# Patient Record
Sex: Male | Born: 1950 | Race: White | Hispanic: No | Marital: Married | Smoking: Heavy tobacco smoker
Health system: Southern US, Community
[De-identification: ages and names within clinical notes are randomized; demographics above are authoritative.]

## PROBLEM LIST (undated history)

## (undated) DIAGNOSIS — J449 Chronic obstructive pulmonary disease, unspecified: Secondary | ICD-10-CM

## (undated) DIAGNOSIS — C801 Malignant (primary) neoplasm, unspecified: Secondary | ICD-10-CM

## (undated) HISTORY — PX: BLADDER REMOVAL: SHX567

---

## 2006-05-11 ENCOUNTER — Emergency Department (HOSPITAL_COMMUNITY): Admission: EM | Admit: 2006-05-11 | Discharge: 2006-05-11 | Payer: Self-pay | Admitting: Emergency Medicine

## 2006-05-20 ENCOUNTER — Ambulatory Visit (HOSPITAL_COMMUNITY): Admission: RE | Admit: 2006-05-20 | Discharge: 2006-05-20 | Payer: Self-pay | Admitting: Anesthesiology

## 2006-06-13 ENCOUNTER — Encounter: Admission: RE | Admit: 2006-06-13 | Discharge: 2006-06-13 | Payer: Self-pay | Admitting: Orthopedic Surgery

## 2008-08-18 IMAGING — CR DG CHEST 2V
2 series · 2 of 2 positions shown · non-contrast
Comparison: none

CLINICAL DATA: 55-year-old male, preop for open reduction calcaneal fractures.  
 CHEST ? 2 VIEW ? 05/20/06:

[view not recorded (1 of 2)]
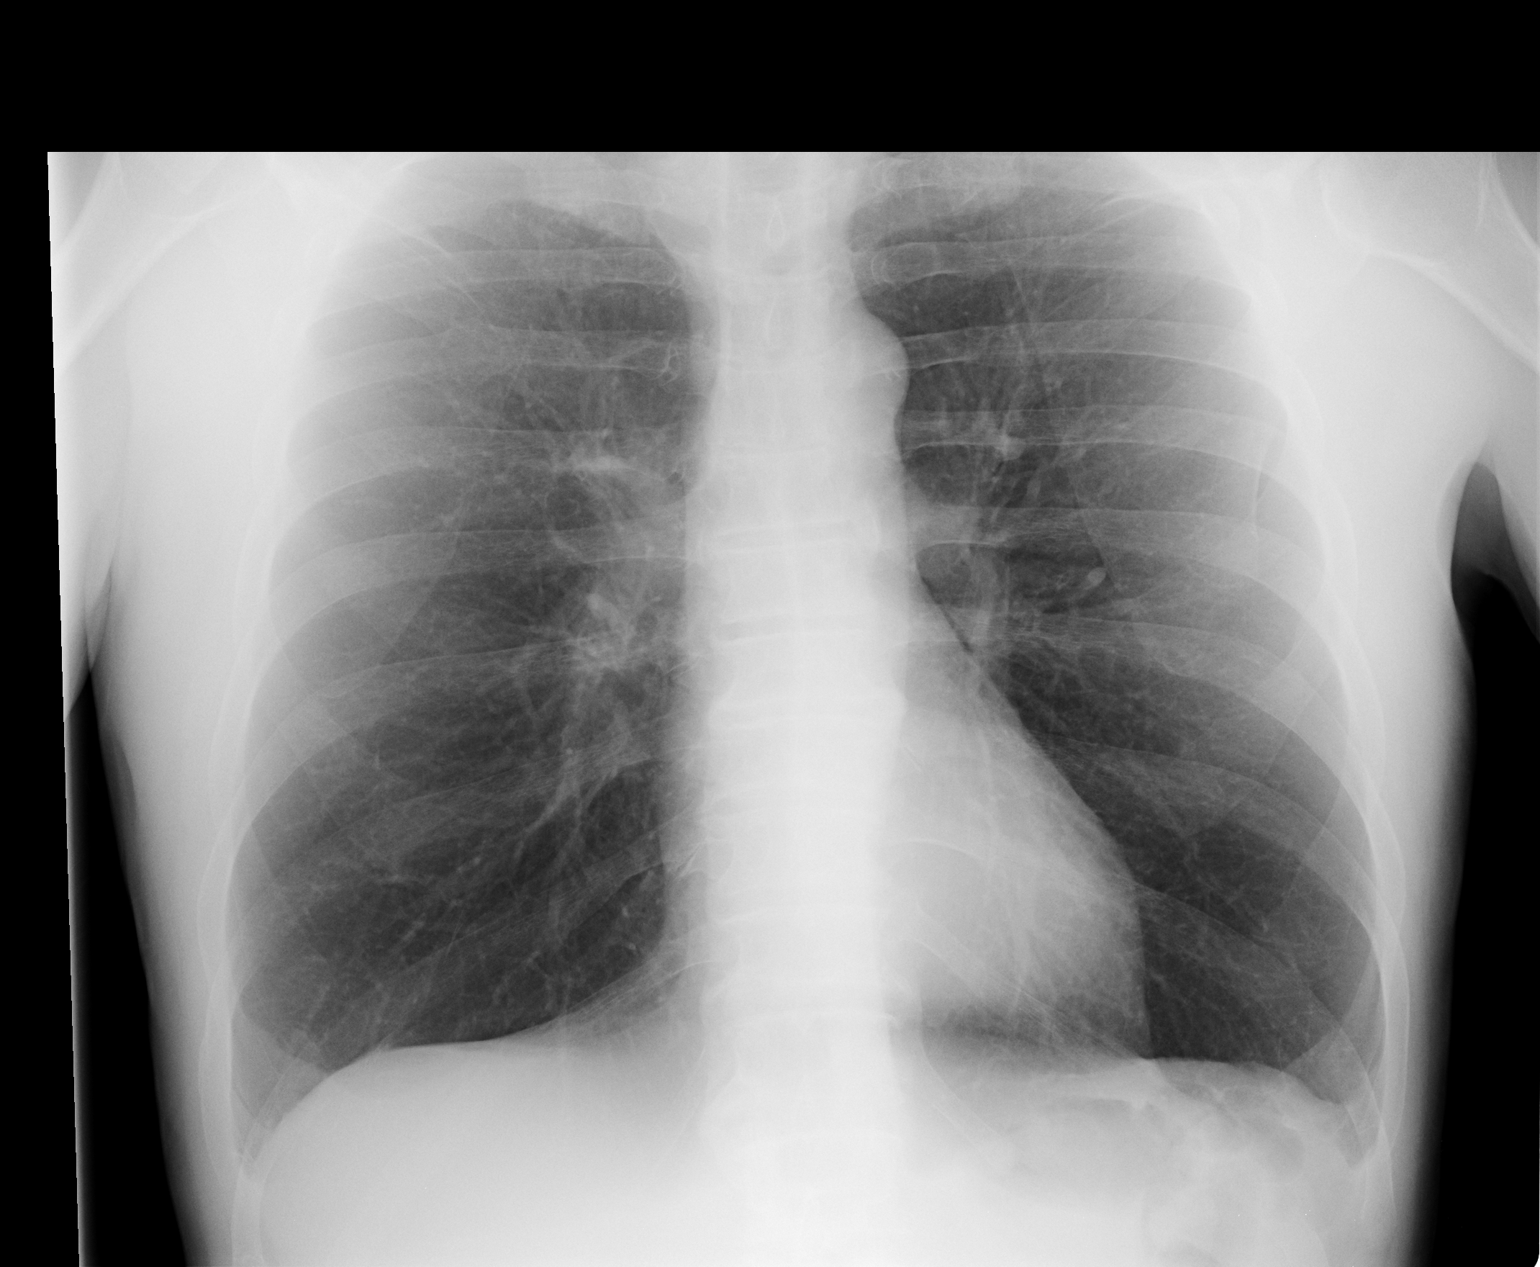

[view not recorded (2 of 2)]
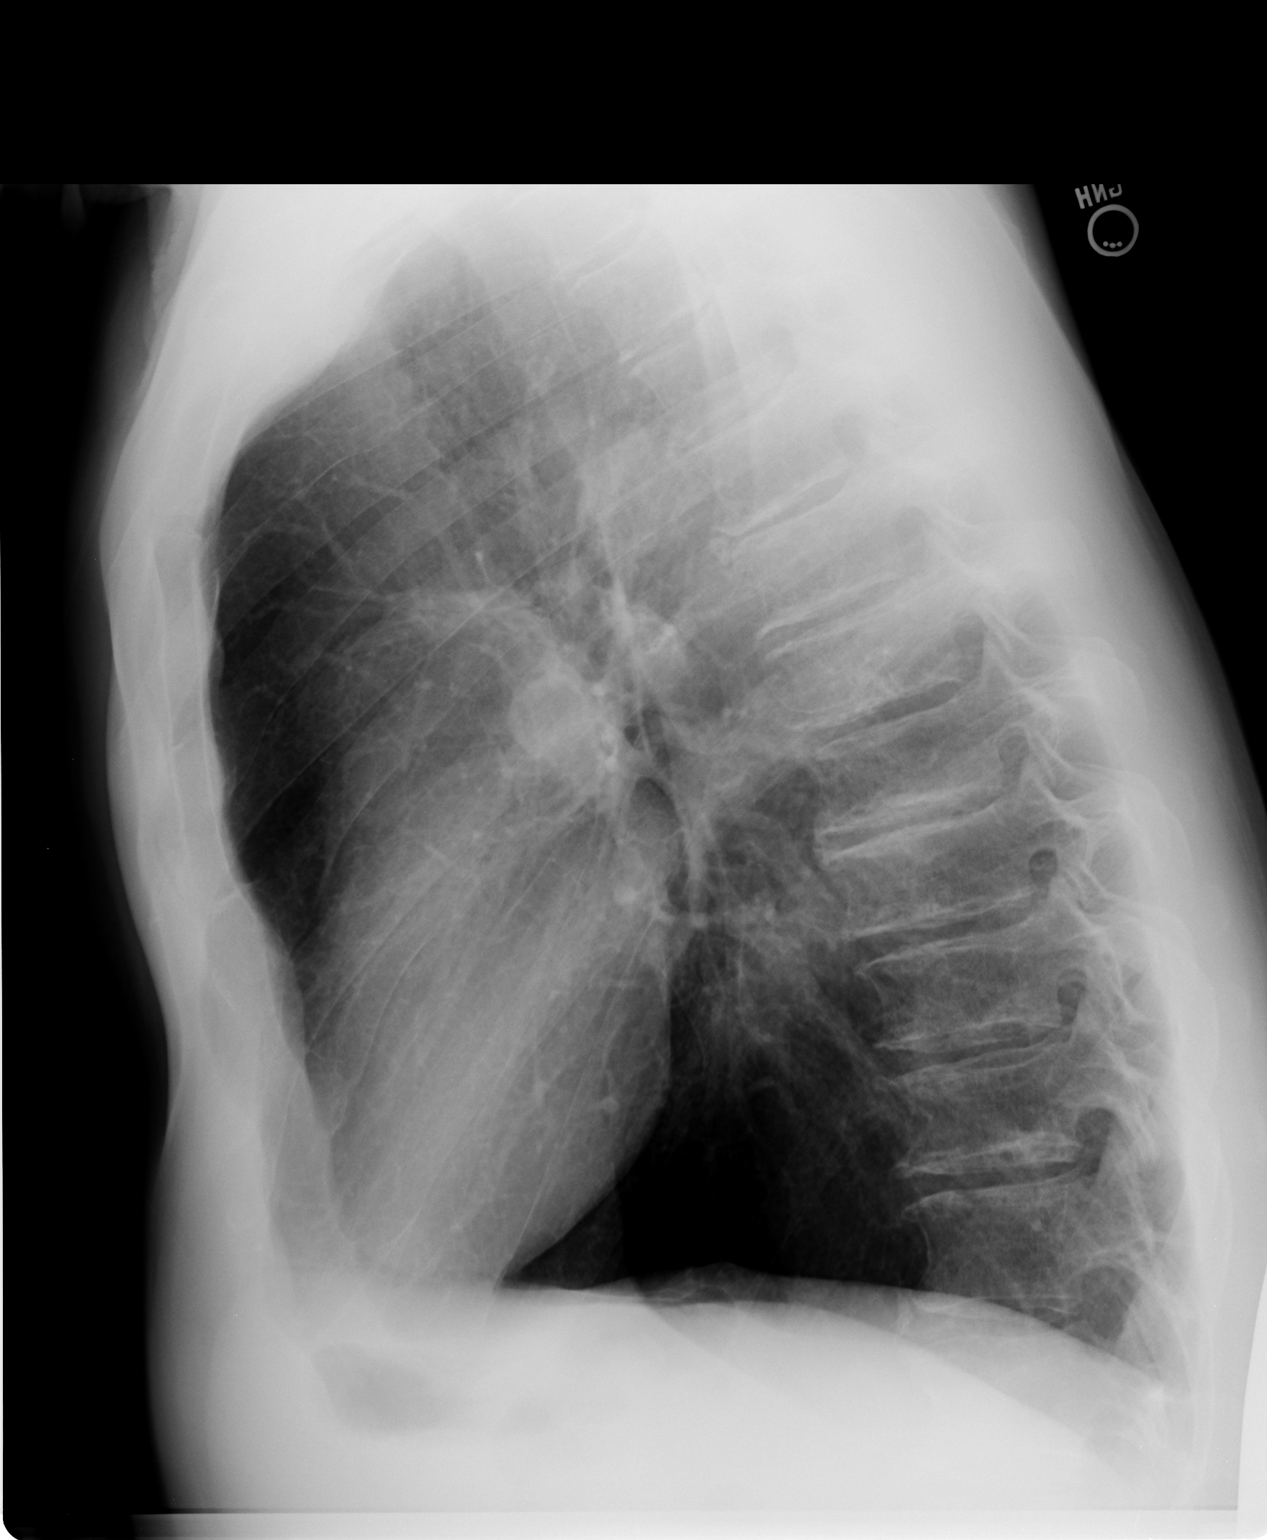

[2 of 2 positions shown; findings below may reference images not displayed]

FINDINGS: The cardiopericardial silhouette is within normal limits for size.  The lungs are hyperexpanded and hyperlucent, compatible with COPD.  No focal airspace disease is evident.  Marked degenerative changes are evident within the thoracic spine.
IMPRESSION: Changes of moderate to severe COPD without focal airspace disease.

## 2008-09-15 ENCOUNTER — Encounter: Admission: RE | Admit: 2008-09-15 | Discharge: 2008-09-15 | Payer: Self-pay | Admitting: Occupational Medicine

## 2010-09-01 NOTE — Op Note (Signed)
NAME:  HANEEF, HALLQUIST NO.:  [id number]   MEDICAL RECORD NO.:  [id number]          PATIENT TYPE:  AMB   LOCATION:  SDS                          FACILITY:  [facility]   PHYSICIAN:  Myrtie Neither, MD      DATE OF BIRTH:  June 22, 1950   DATE OF PROCEDURE:  05/20/2006  DATE OF DISCHARGE:                               OPERATIVE REPORT   PREOPERATIVE DIAGNOSIS:  Bilateral calcaneal fractures comminuted.   POSTOPERATIVE DIAGNOSIS:  Bilateral calcaneal fractures comminuted.   ANESTHESIA:  General.   PROCEDURE:  Closed manipulated reduction bilateral calcaneal fractures  and application of short-leg cast.   DESCRIPTION OF PROCEDURE:  The patient was taken to the operating room  and given general anesthesia and placed in the prone position.  C-arm  was used to visualize fracture reduction. Manipulated reduction of the  right calcaneal fracture was done initially followed by application of  fiberglass short leg cast. Next the left calcaneal fracture was  manipulated to reduce girth of the fracture and attempted to try  compressive fragments as close as possible to normal anatomy.  After  adequate manipulation visualization the C-arm demonstrated much improved  the fracture alignment. Short-leg cast was applied, fiberglass. The  patient tolerated procedure quite well recovery room in stable and  satisfactory position.      Myrtie Neither, MD  Electronically Signed     AC/MEDQ  D:  05/20/2006  T:  05/20/2006  Job:  [id number]

## 2012-01-01 ENCOUNTER — Emergency Department (HOSPITAL_COMMUNITY): Payer: Self-pay

## 2012-01-01 ENCOUNTER — Encounter (HOSPITAL_COMMUNITY): Payer: Self-pay | Admitting: *Deleted

## 2012-01-01 ENCOUNTER — Emergency Department (HOSPITAL_COMMUNITY)
Admission: EM | Admit: 2012-01-01 | Discharge: 2012-01-01 | Disposition: A | Discharge status: Home / Self Care | Payer: Self-pay | Attending: Emergency Medicine | Admitting: Emergency Medicine

## 2012-01-01 DIAGNOSIS — J4489 Other specified chronic obstructive pulmonary disease: Secondary | ICD-10-CM | POA: Insufficient documentation

## 2012-01-01 DIAGNOSIS — J439 Emphysema, unspecified: Secondary | ICD-10-CM

## 2012-01-01 DIAGNOSIS — F17200 Nicotine dependence, unspecified, uncomplicated: Secondary | ICD-10-CM | POA: Insufficient documentation

## 2012-01-01 DIAGNOSIS — J449 Chronic obstructive pulmonary disease, unspecified: Secondary | ICD-10-CM | POA: Insufficient documentation

## 2012-01-01 HISTORY — DX: Chronic obstructive pulmonary disease, unspecified: J44.9

## 2012-01-01 MED ORDER — ALBUTEROL SULFATE HFA 108 (90 BASE) MCG/ACT IN AERS
2.0000 | INHALATION_SPRAY | RESPIRATORY_TRACT | Status: DC | PRN
  Administered 2012-01-01: 2 via RESPIRATORY_TRACT
  Filled 2012-01-01: qty 6.7

## 2012-01-01 MED ORDER — ALBUTEROL SULFATE (5 MG/ML) 0.5% IN NEBU
5.0000 mg | INHALATION_SOLUTION | Freq: Once | RESPIRATORY_TRACT | Status: AC
  Administered 2012-01-01: 5 mg via RESPIRATORY_TRACT

## 2012-01-01 MED ORDER — PREDNISONE 10 MG PO TABS: 10.0000 mg | ORAL_TABLET | Freq: Two times a day (BID) | ORAL | Status: DC

## 2012-01-01 MED ORDER — IPRATROPIUM BROMIDE 0.02 % IN SOLN
0.5000 mg | Freq: Once | RESPIRATORY_TRACT | Status: AC
  Administered 2012-01-01: 0.5 mg via RESPIRATORY_TRACT

## 2012-01-01 NOTE — ED Notes (Addendum)
Denies sob, (pt is a "smoker, woodworker, chews tobacco"). Reports: "could not get enough air earlier tonight", onset 3hrs ago, gradually progressively worse. "Starting to ease up in triage". Thought i would pass out & felt dizzy PTA. (denies: pain, nvd, fever), has chronic congested smokers cough for yrs. Increase wob, insp/exp wheezing.

## 2012-01-01 NOTE — ED Provider Notes (Signed)
History     CSN: [id number]  Arrival date & time 01/01/12  2110   First MD Initiated Contact with Patient 01/01/12 2327      Chief Complaint  Patient presents with  . Shortness of Breath    (Consider location/radiation/quality/duration/timing/severity/associated sxs/prior treatment) HPI History provided by pt.   Pt presents w/ c/o SOB.  Acute onset at 7pm while getting into bed.  Mildly aggravated by exertion and improves w/ lying flat.  No associated fever, worse than baseline cough, CP, N/V/D, LE pain/edema.  Had a breathing treatment in triage w/ relief of symptoms and is now able to ambulate w/out dyspnea.  No RF for PE.  Pt smokes 3/4 PPD but otherwise no RF for ACS.    Past Medical History  Diagnosis Date  . COPD (chronic obstructive pulmonary disease)     History reviewed. No pertinent past surgical history.  No family history on file.  History  Substance Use Topics  . Smoking status: Heavy Tobacco Smoker  . Smokeless tobacco: Current User  . Alcohol Use: Yes      Review of Systems  All other systems reviewed and are negative.    Allergies  Review of patient's allergies indicates no known allergies.  Home Medications   Current Outpatient Rx  Name Route Sig Dispense Refill  . ADULT MULTIVITAMIN W/MINERALS CH Oral Take 1 tablet by mouth daily.      BP 125/108  Pulse 92  Temp 97.8 F (36.6 C) (Oral)  Resp 20  SpO2 97%  Physical Exam  Nursing note and vitals reviewed. Constitutional: He is oriented to person, place, and time. He appears well-developed and well-nourished. No distress.  HENT:  Head: Normocephalic and atraumatic.  Eyes:       Normal appearance  Neck: Normal range of motion.  Cardiovascular: Normal rate, regular rhythm and intact distal pulses.   Pulmonary/Chest: Effort normal and breath sounds normal. No respiratory distress. He exhibits no tenderness.       No pleuritic pain reported  Abdominal: Soft. Bowel sounds are normal. He  exhibits no distension. There is no tenderness. There is no guarding.  Musculoskeletal: Normal range of motion.       No peripheral edema or calf tenderness  Neurological: He is alert and oriented to person, place, and time.  Skin: Skin is warm and dry. No rash noted.  Psychiatric: He has a normal mood and affect. His behavior is normal.    ED Course  Procedures (including critical care time)   Date: 01/01/2012  Rate: 84  Rhythm: normal sinus rhythm  QRS Axis: normal  Intervals: normal  ST/T Wave abnormalities: normal  Conduction Disutrbances:none  Narrative Interpretation:   Old EKG Reviewed: none available   Labs Reviewed - No data to display Dg Chest 2 View  01/01/2012  *RADIOLOGY REPORT*  Clinical Data: Shortness of breath.  History of COPD.  CHEST - 2 VIEW  Comparison: Plain film chest 09/15/2008.  Findings: The chest is hyperexpanded with attenuation the pulmonary vasculature.  Lungs are clear.  The heart size is normal.  No pneumothorax or pleural fluid.  IMPRESSION: Emphysema without acute disease.   Original Report Authenticated By: Bernadene Bell. D'ALESSIO, M.D.      1. Emphysema       MDM  61yo smoker, otherwise healthy, presents w/ dyspnea.  No RF for PE and low risk ACS.  On exam, VS w/in nml range, no respiratory distress, lungs clear but sounds slightly diminished, no LE edema/ttp.  EKG non-ischemic, CXR shows emphysema and pt had relief of sx w/ breathing treatment in triage.  He is ambulatory in [employer] w/out SOB.  Symptoms likely d/t COPD exacerbation.  Pt d/c'd home w/ albuterol inhaler, 5 day course of prednisone and referral to [facility].  Return precautions discussed.         Otilio Miu, [place] 01/01/12 [phone]

## 2012-01-01 NOTE — ED Notes (Signed)
The pt was lying in bed when ever he began to get sob at approx  1900.  He is currently a smoker and reports he has been breathing in  [employer].  No nv he has had no appetite for the past few months and he has lost weight.  He is alert denies pain anywhere.  He still has rapid respirations but reports that he is breathing better.

## 2012-01-01 NOTE — ED Notes (Signed)
Wanting to leave, encouraged to stay, rationale given, out to speak with wife in w/r.

## 2012-01-02 NOTE — ED Provider Notes (Signed)
Medical screening examination/treatment/procedure(s) were performed by non-physician practitioner and as supervising physician I was immediately available for consultation/collaboration.  Tobin Chad, MD 01/02/12 [phone]

## 2013-08-17 ENCOUNTER — Emergency Department (HOSPITAL_COMMUNITY): Payer: Non-veteran care

## 2013-08-17 ENCOUNTER — Encounter (HOSPITAL_COMMUNITY): Payer: Self-pay | Admitting: Emergency Medicine

## 2013-08-17 ENCOUNTER — Emergency Department (HOSPITAL_COMMUNITY)
Admission: EM | Admit: 2013-08-17 | Discharge: 2013-08-17 | Disposition: A | Discharge status: Home / Self Care | Payer: Non-veteran care | Attending: Emergency Medicine | Admitting: Emergency Medicine

## 2013-08-17 DIAGNOSIS — N289 Disorder of kidney and ureter, unspecified: Secondary | ICD-10-CM | POA: Insufficient documentation

## 2013-08-17 DIAGNOSIS — N179 Acute kidney failure, unspecified: Secondary | ICD-10-CM

## 2013-08-17 DIAGNOSIS — J449 Chronic obstructive pulmonary disease, unspecified: Secondary | ICD-10-CM | POA: Insufficient documentation

## 2013-08-17 DIAGNOSIS — R55 Syncope and collapse: Secondary | ICD-10-CM | POA: Insufficient documentation

## 2013-08-17 DIAGNOSIS — E871 Hypo-osmolality and hyponatremia: Secondary | ICD-10-CM | POA: Insufficient documentation

## 2013-08-17 DIAGNOSIS — Z8546 Personal history of malignant neoplasm of prostate: Secondary | ICD-10-CM | POA: Insufficient documentation

## 2013-08-17 DIAGNOSIS — Z8551 Personal history of malignant neoplasm of bladder: Secondary | ICD-10-CM | POA: Insufficient documentation

## 2013-08-17 DIAGNOSIS — J4489 Other specified chronic obstructive pulmonary disease: Secondary | ICD-10-CM | POA: Insufficient documentation

## 2013-08-17 DIAGNOSIS — F17200 Nicotine dependence, unspecified, uncomplicated: Secondary | ICD-10-CM | POA: Insufficient documentation

## 2013-08-17 DIAGNOSIS — H538 Other visual disturbances: Secondary | ICD-10-CM | POA: Insufficient documentation

## 2013-08-17 DIAGNOSIS — E860 Dehydration: Secondary | ICD-10-CM | POA: Insufficient documentation

## 2013-08-17 LAB — URINE MICROSCOPIC-ADD ON

## 2013-08-17 LAB — URINALYSIS, ROUTINE W REFLEX MICROSCOPIC
BILIRUBIN URINE: NEGATIVE
Glucose, UA: NEGATIVE mg/dL
Ketones, ur: NEGATIVE mg/dL
Nitrite: NEGATIVE
PH: 6.5 (ref 5.0–8.0)
Protein, ur: 100 mg/dL — AB
SPECIFIC GRAVITY, URINE: 1.012 (ref 1.005–1.030)
UROBILINOGEN UA: 0.2 mg/dL (ref 0.0–1.0)

## 2013-08-17 LAB — CBC WITH DIFFERENTIAL/PLATELET
Basophils Absolute: 0 10*3/uL (ref 0.0–0.1)
Basophils Relative: 0 % (ref 0–1)
EOS PCT: 0 % (ref 0–5)
Eosinophils Absolute: 0 10*3/uL (ref 0.0–0.7)
HCT: 40.7 % (ref 39.0–52.0)
HEMOGLOBIN: 13.9 g/dL (ref 13.0–17.0)
Lymphocytes Relative: 9 % — ABNORMAL LOW (ref 12–46)
Lymphs Abs: 1.2 10*3/uL (ref 0.7–4.0)
MCH: 29.4 pg (ref 26.0–34.0)
MCHC: 34.2 g/dL (ref 30.0–36.0)
MCV: 86 fL (ref 78.0–100.0)
MONO ABS: 0 10*3/uL — AB (ref 0.1–1.0)
Monocytes Relative: 0 % — ABNORMAL LOW (ref 3–12)
NEUTROS ABS: 12.1 10*3/uL — AB (ref 1.7–7.7)
Neutrophils Relative %: 91 % — ABNORMAL HIGH (ref 43–77)
PLATELETS: 299 10*3/uL (ref 150–400)
RBC: 4.73 MIL/uL (ref 4.22–5.81)
RDW: 12.8 % (ref 11.5–15.5)
WBC: 13.4 10*3/uL — AB (ref 4.0–10.5)

## 2013-08-17 LAB — BASIC METABOLIC PANEL
BUN: 38 mg/dL — AB (ref 6–23)
CALCIUM: 8.3 mg/dL — AB (ref 8.4–10.5)
CHLORIDE: 91 meq/L — AB (ref 96–112)
CO2: 24 meq/L (ref 19–32)
Creatinine, Ser: 1.8 mg/dL — ABNORMAL HIGH (ref 0.50–1.35)
GFR, EST AFRICAN AMERICAN: 44 mL/min — AB (ref >90)
GFR, EST NON AFRICAN AMERICAN: 38 mL/min — AB (ref >90)
Glucose, Bld: 119 mg/dL — ABNORMAL HIGH (ref 70–99)
Potassium: 3.7 mEq/L (ref 3.7–5.3)
Sodium: 131 mEq/L — ABNORMAL LOW (ref 137–147)

## 2013-08-17 LAB — TROPONIN I

## 2013-08-17 MED ORDER — SODIUM CHLORIDE 0.9 % IV BOLUS (SEPSIS)
1000.0000 mL | Freq: Once | INTRAVENOUS | Status: AC
  Administered 2013-08-17: 1000 mL via INTRAVENOUS

## 2013-08-17 NOTE — ED Notes (Signed)
Zavitz, MD at bedside 

## 2013-08-17 NOTE — ED Notes (Signed)
Pt in from work via [employer] [facility], with witnessed syncopal episode at work, estimated 2 mins in length, denies falling with episode & -LOC, per EMS at scene pt was witnessed to have x 3 syncopal episodes with L sided posturing on 2 nd episode, pt was not postictal post events, did have some repetitive questions directly after the event, pt A&Ox 4, follows commands, speaks in complete sentences, denies CP, SOB, N/V/D, has Stage 3 bladdar & prostate CA, tx rcvd Weds, Thurs, & Friday, NAD

## 2013-08-17 NOTE — Discharge Instructions (Signed)
Continue to take your antibiotics for a urine infection. Stay well-hydrated and have your kidney function rechecked in the next week. Call your doctor and specialist today to arrange very close followup. Return to the ER for any new or worsening symptoms.

## 2013-08-17 NOTE — ED Provider Notes (Signed)
CSN: [id number]     Arrival date & time 08/17/13  1027 History   First MD Initiated Contact with Patient 08/17/13 1035     No chief complaint on file.    (Consider location/radiation/quality/duration/timing/severity/associated sxs/prior Treatment) HPI Comments: 63 year old male with history of bladder cancer, possible lung cancer, prostate cancer stage III, last chemotherapy Wednesday Thursday and Friday of this past week presents after lightheaded and syncopal episode prior to arrival. Patient is walking around at work yesterday fell lightheaded the next thing he knew was on the ground. No reported head injury. Patient had no chest pain, headache or shortness of breath prior to or after. Denies blood thinners. No blood clot history or leg swelling or leg pain. Patient feels dehydrated. Patient was seen at the [facility] recently for imaging of his brain was told it was normal. Patient has close outpatient followup. No cardiac history known. No seizure activity or postictal after 2 episodes however patient did have mild posturing per report. No known seizure disorder or brain metastases.  The history is provided by the patient.    Past Medical History  Diagnosis Date  . COPD (chronic obstructive pulmonary disease)    No past surgical history on file. No family history on file. History  Substance Use Topics  . Smoking status: Heavy Tobacco Smoker  . Smokeless tobacco: Current User  . Alcohol Use: Yes    Review of Systems  Constitutional: Negative for fever and chills.  HENT: Negative for congestion.   Eyes: Positive for visual disturbance.  Respiratory: Negative for shortness of breath.   Cardiovascular: Negative for chest pain.  Gastrointestinal: Negative for vomiting and abdominal pain.  Genitourinary: Negative for dysuria and flank pain.  Musculoskeletal: Negative for back pain, neck pain and neck stiffness.  Skin: Negative for rash.  Neurological: Positive for syncope and  light-headedness. Negative for headaches.      Allergies  Review of patient's allergies indicates no known allergies.  Home Medications   Prior to Admission medications   Medication Sig Start Date End Date Taking? Authorizing Provider  Multiple Vitamin (MULTIVITAMIN WITH MINERALS) TABS Take 1 tablet by mouth daily.    Historical Provider, MD   BP 121/82  Pulse 72  Temp(Src) 98.7 F (37.1 C) (Oral)  Resp 14  Wt 138 lb (62.596 kg)  SpO2 100% Physical Exam  Nursing note and vitals reviewed. Constitutional: He is oriented to person, place, and time. He appears well-developed and well-nourished.  HENT:  Head: Normocephalic and atraumatic.  Dry mucous membranes  Eyes: Conjunctivae are normal. Right eye exhibits no discharge. Left eye exhibits no discharge.  Neck: Normal range of motion. Neck supple. No tracheal deviation present.  Cardiovascular: Normal rate and regular rhythm.   Pulmonary/Chest: Effort normal and breath sounds normal.  Abdominal: Soft. He exhibits no distension. There is no tenderness. There is no guarding.  Musculoskeletal: He exhibits no edema.  No midline vertebral tenderness full range of motion of head and neck  Neurological: He is alert and oriented to person, place, and time. GCS eye subscore is 4. GCS verbal subscore is 5. GCS motor subscore is 6.  5+ strength in UE and LE with f/e at major joints. Sensation to palpation intact in UE and LE. CNs 2-12 grossly intact.  EOMFI.  PERRL.   Finger nose and coordination intact bilateral.   Visual fields intact to finger testing.   Skin: Skin is warm. No rash noted.  Psychiatric: He has a normal mood and affect.  ED Course  Procedures (including critical care time) Labs Review Labs Reviewed  BASIC METABOLIC PANEL - Abnormal; Notable for the following:    Sodium 131 (*)    Chloride 91 (*)    Glucose, Bld 119 (*)    BUN 38 (*)    Creatinine, Ser 1.80 (*)    Calcium 8.3 (*)    GFR calc non Af Amer 38  (*)    GFR calc Af Amer 44 (*)    All other components within normal limits  CBC WITH DIFFERENTIAL - Abnormal; Notable for the following:    WBC 13.4 (*)    Neutrophils Relative % 91 (*)    Neutro Abs 12.1 (*)    Lymphocytes Relative 9 (*)    Monocytes Relative 0 (*)    Monocytes Absolute 0.0 (*)    All other components within normal limits  URINALYSIS, ROUTINE W REFLEX MICROSCOPIC - Abnormal; Notable for the following:    APPearance CLOUDY (*)    Hgb urine dipstick SMALL (*)    Protein, ur 100 (*)    Leukocytes, UA SMALL (*)    All other components within normal limits  URINE MICROSCOPIC-ADD ON - Abnormal; Notable for the following:    Bacteria, UA FEW (*)    All other components within normal limits  TROPONIN I    Imaging Review No results found.   EKG Interpretation None     EKG not in [facility] Heart rate 74, normal QT normal PR, intermittent axis, mild ST elevation V2 without reciprocal changes, no STEMI. MDM   Final diagnoses:  Syncope  Dehydration  Acute renal failure  Hyponatremia  Clinical concern for dehydration/prerenal as cause for syncope versus brain metastases versus less likely cardiac. Plan for EKG, troponin, labs, CT head and fluid bolus. EKG reviewed no acute findings.  Patient received 1 L fluid bolus in the ER. CT head review no acute findings. Patient has a normal neurologic exam in ED. Patient has no symptoms on recheck and I discussed telemetry observation for further fluids and assessment. Patient has capacity to make decisions and prefers to followup closely with his outpatient doctors and will make the phone call release today. Patient is on antibiotics for current urine infection. No fever in ED.  Results and differential diagnosis were discussed with the patient. Close follow up outpatient was discussed, patient comfortable with the plan.   Filed Vitals:   08/17/13 1039 08/17/13 1041 08/17/13 1049 08/17/13 1205  BP:   121/82 119/84  Pulse:    72 70  Temp:   98.7 F (37.1 C)   TempSrc:   Oral   Resp:   14 16  Weight:  138 lb (62.596 kg)    SpO2: 96%  100% 98%          Mariea Clonts, MD 08/17/13 1258

## 2013-08-17 NOTE — ED Notes (Signed)
Family at bedside. 

## 2015-11-16 IMAGING — CT CT HEAD W/O CM
2 series · 16 of 30 positions shown, 20 images · non-contrast
Comparison: None.

CLINICAL DATA: Syncope.  Abnormal posturing.

EXAM:
CT HEAD WITHOUT CONTRAST
TECHNIQUE: Contiguous axial images were obtained from the base of the skull
through the vertex without intravenous contrast.

[Series 201: head w/o, idose (1) · axial · non-contrast · 0.49mm/px · z∈[+97,+227]mm · 13 of 32 slices shown, 17 images]
[im 3/32  brain]
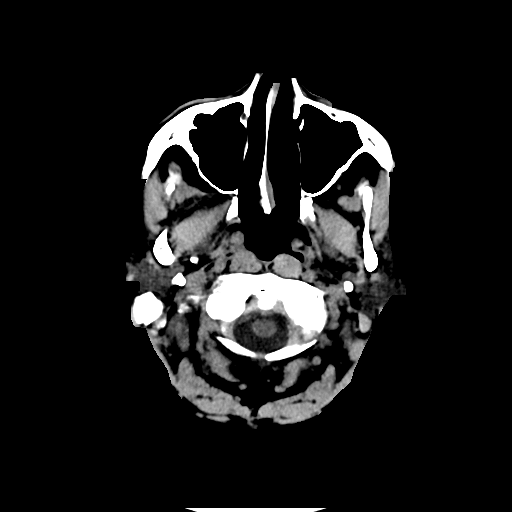
[im 3/32  bone]
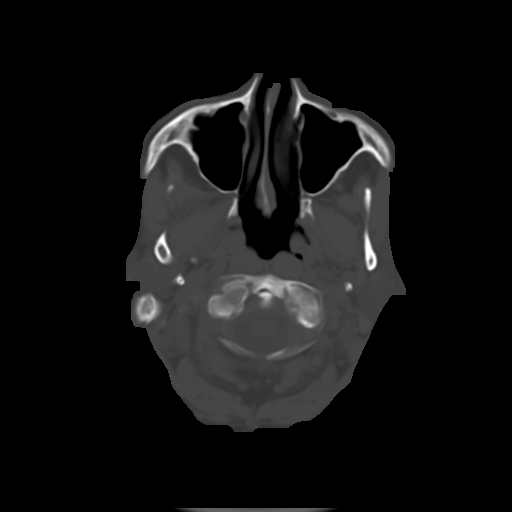
[im 5/32  brain]
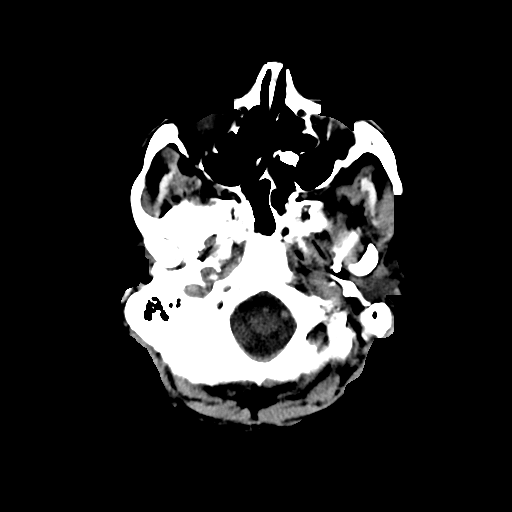
[im 7/32  brain]
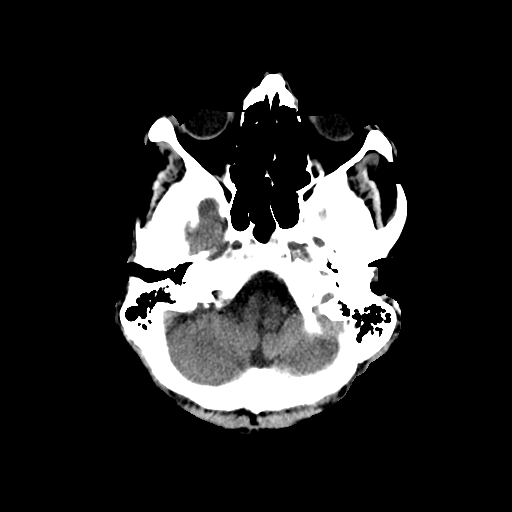
[im 9/32  brain]
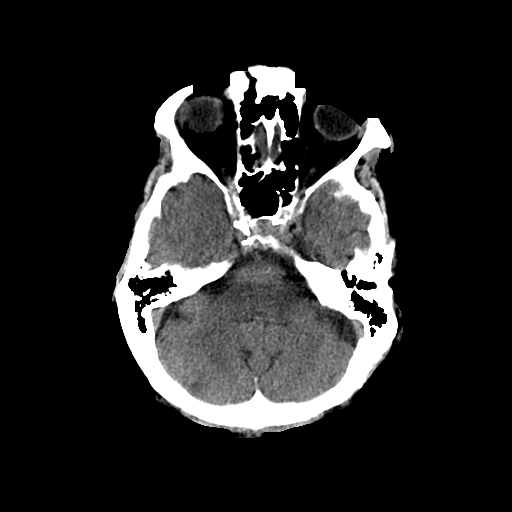
[im 12/32  brain]
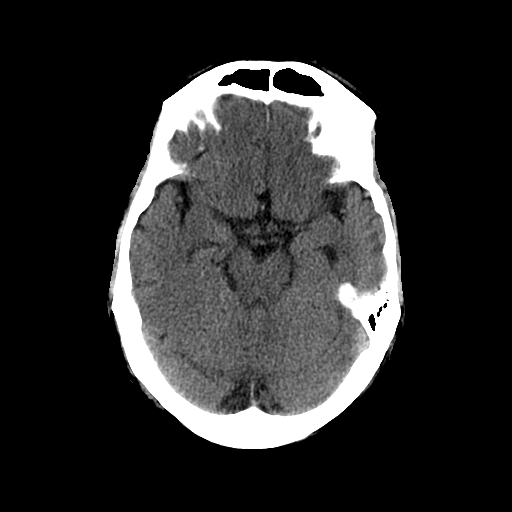
[im 12/32  bone]
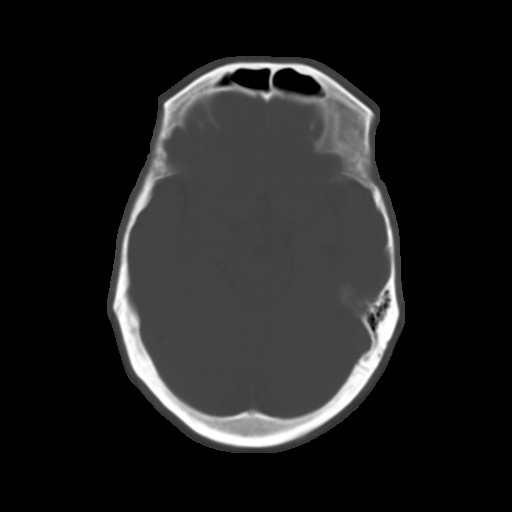
[im 14/32  brain]
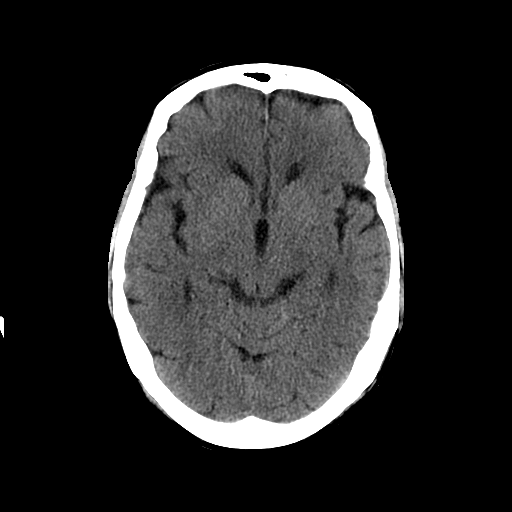
[im 16/32  brain]
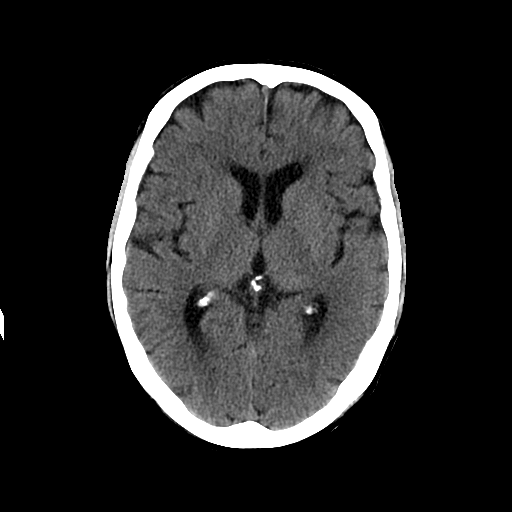
[im 18/32  brain]
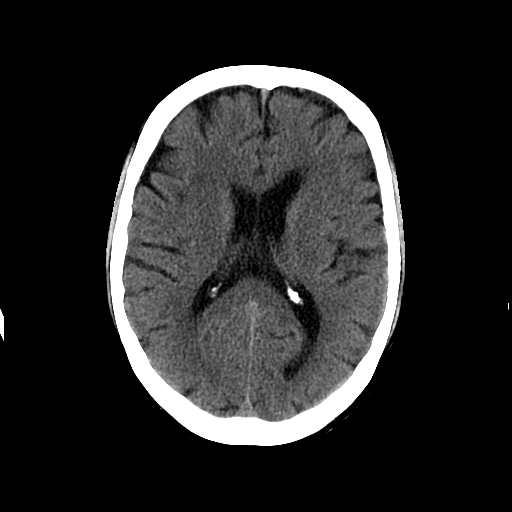
[im 20/32  brain]
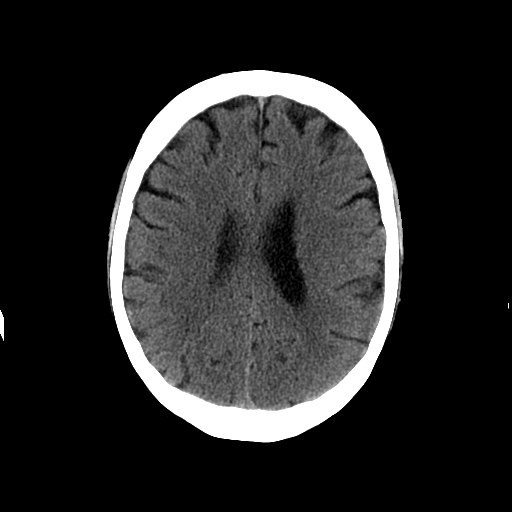
[im 20/32  bone]
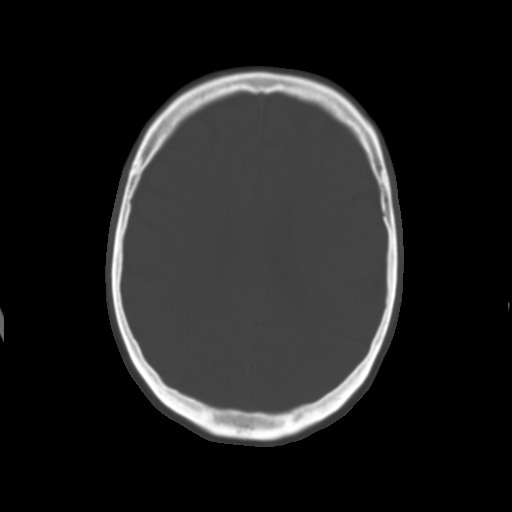
[im 23/32  brain]
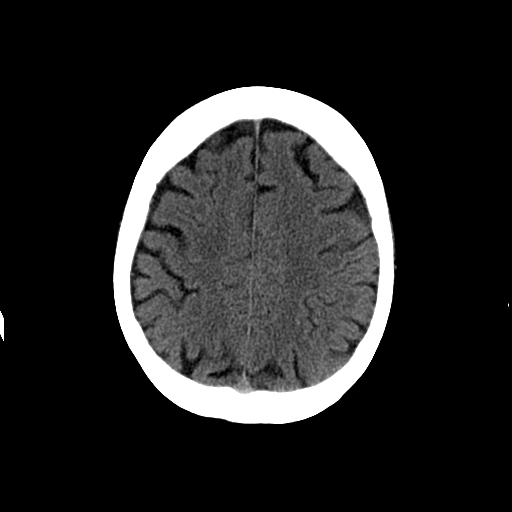
[im 25/32  brain]
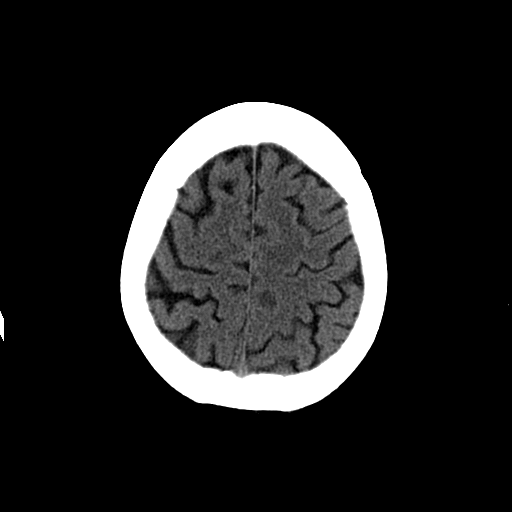
[im 27/32  brain]
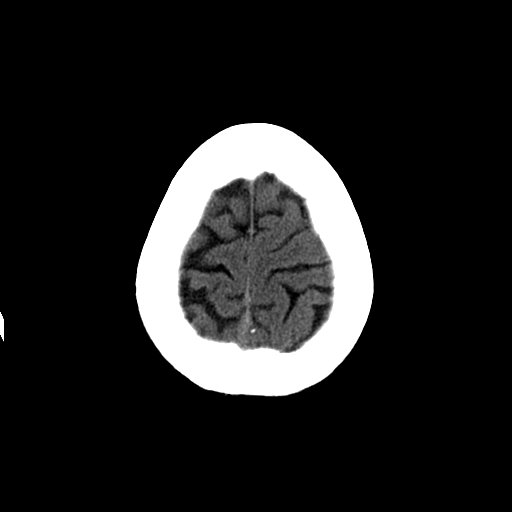
[im 29/32  brain]
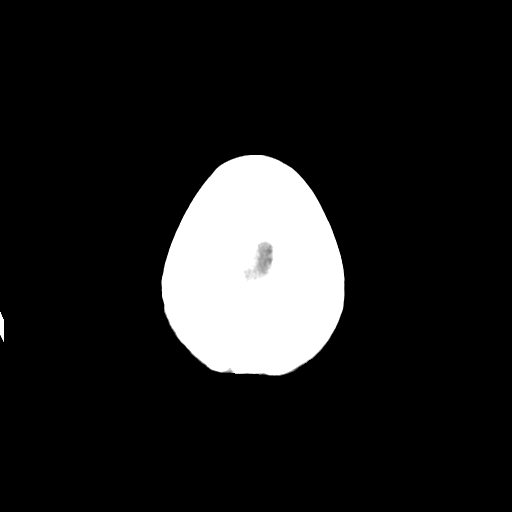
[im 29/32  bone]
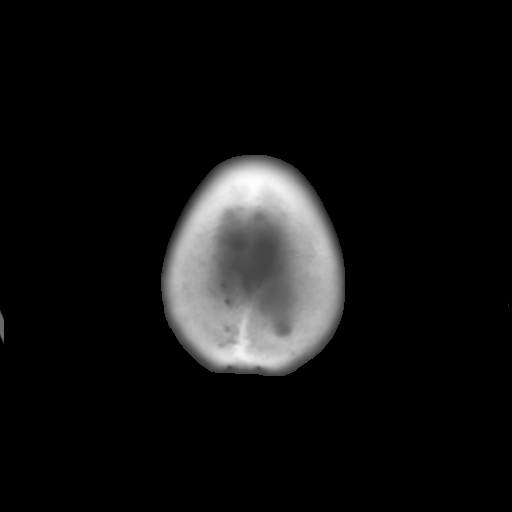

[Series 202: head w/o bone, idose (1) · axial · non-contrast · 0.49mm/px · z∈[+97,+142]mm · 3 of 32 slices shown]
[im 3/32  bone]
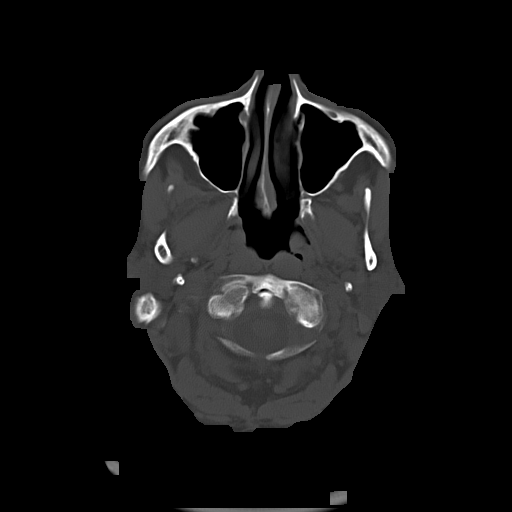
[im 7/32  bone]
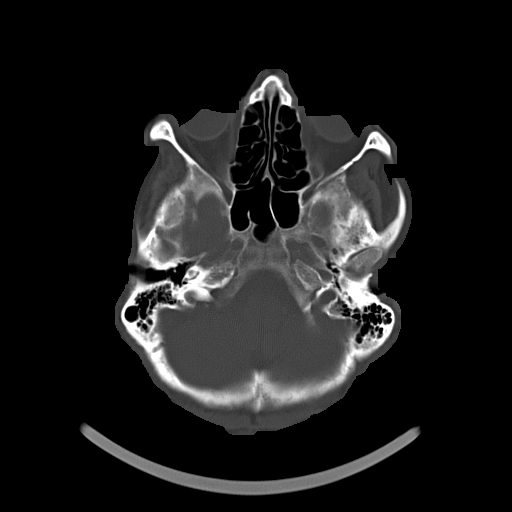
[im 12/32  bone]
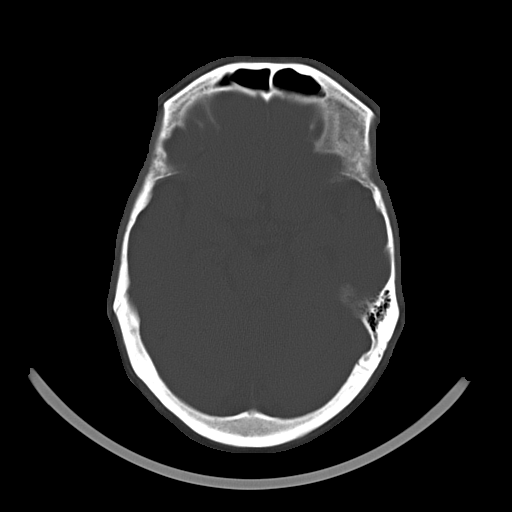

[16 of 30 positions shown; findings below may reference images not displayed]

FINDINGS: No evidence of an acute infarct, acute hemorrhage, mass lesion, mass
effect or hydrocephalus. There may be minimal atrophy. Minimal
periventricular low attenuation. Visualized portions of the
paranasal sinuses and mastoid air cells are clear. No fracture.
IMPRESSION: 1. No acute intracranial abnormality.
2. Mild atrophy and minimal chronic microvascular white matter
ischemic change.

## 2016-05-04 ENCOUNTER — Encounter (HOSPITAL_COMMUNITY): Payer: Self-pay | Admitting: *Deleted

## 2016-05-04 ENCOUNTER — Emergency Department (HOSPITAL_COMMUNITY)
Admission: EM | Admit: 2016-05-04 | Discharge: 2016-05-04 | Discharge status: Against Medical Advice | Payer: Non-veteran care | Attending: Emergency Medicine | Admitting: Emergency Medicine

## 2016-05-04 ENCOUNTER — Emergency Department (HOSPITAL_COMMUNITY): Payer: Non-veteran care

## 2016-05-04 DIAGNOSIS — R200 Anesthesia of skin: Secondary | ICD-10-CM | POA: Diagnosis present

## 2016-05-04 DIAGNOSIS — Z7901 Long term (current) use of anticoagulants: Secondary | ICD-10-CM | POA: Diagnosis not present

## 2016-05-04 DIAGNOSIS — Z8551 Personal history of malignant neoplasm of bladder: Secondary | ICD-10-CM | POA: Diagnosis not present

## 2016-05-04 DIAGNOSIS — R531 Weakness: Secondary | ICD-10-CM | POA: Diagnosis not present

## 2016-05-04 DIAGNOSIS — Z7982 Long term (current) use of aspirin: Secondary | ICD-10-CM | POA: Diagnosis not present

## 2016-05-04 DIAGNOSIS — J449 Chronic obstructive pulmonary disease, unspecified: Secondary | ICD-10-CM | POA: Insufficient documentation

## 2016-05-04 DIAGNOSIS — F17210 Nicotine dependence, cigarettes, uncomplicated: Secondary | ICD-10-CM | POA: Insufficient documentation

## 2016-05-04 DIAGNOSIS — C799 Secondary malignant neoplasm of unspecified site: Secondary | ICD-10-CM

## 2016-05-04 DIAGNOSIS — C801 Malignant (primary) neoplasm, unspecified: Secondary | ICD-10-CM

## 2016-05-04 HISTORY — DX: Malignant (primary) neoplasm, unspecified: C80.1

## 2016-05-04 LAB — COMPREHENSIVE METABOLIC PANEL
ALT: 16 U/L — ABNORMAL LOW (ref 17–63)
ANION GAP: 11 (ref 5–15)
AST: 25 U/L (ref 15–41)
Albumin: 3.7 g/dL (ref 3.5–5.0)
Alkaline Phosphatase: 79 U/L (ref 38–126)
BILIRUBIN TOTAL: 1.3 mg/dL — AB (ref 0.3–1.2)
BUN: 32 mg/dL — AB (ref 6–20)
CO2: 19 mmol/L — ABNORMAL LOW (ref 22–32)
Calcium: 9.4 mg/dL (ref 8.9–10.3)
Chloride: 103 mmol/L (ref 101–111)
Creatinine, Ser: 1.79 mg/dL — ABNORMAL HIGH (ref 0.61–1.24)
GFR, EST AFRICAN AMERICAN: 44 mL/min — AB (ref >60)
GFR, EST NON AFRICAN AMERICAN: 38 mL/min — AB (ref >60)
Glucose, Bld: 102 mg/dL — ABNORMAL HIGH (ref 65–99)
Potassium: 5.2 mmol/L — ABNORMAL HIGH (ref 3.5–5.1)
Sodium: 133 mmol/L — ABNORMAL LOW (ref 135–145)
TOTAL PROTEIN: 6.8 g/dL (ref 6.5–8.1)

## 2016-05-04 LAB — CBC
HCT: 38.6 % — ABNORMAL LOW (ref 39.0–52.0)
HEMOGLOBIN: 13.1 g/dL (ref 13.0–17.0)
MCH: 28.7 pg (ref 26.0–34.0)
MCHC: 33.9 g/dL (ref 30.0–36.0)
MCV: 84.5 fL (ref 78.0–100.0)
Platelets: 276 10*3/uL (ref 150–400)
RBC: 4.57 MIL/uL (ref 4.22–5.81)
RDW: 16.6 % — AB (ref 11.5–15.5)
WBC: 10.2 10*3/uL (ref 4.0–10.5)

## 2016-05-04 NOTE — ED Notes (Signed)
Pt back from MRI and requesting to leave. Reports "those results can be sent to the [place] I don't understand what I need to wait for." MD Schlossman made aware of patient request and at bedside to discuss MRI with patient.

## 2016-05-04 NOTE — ED Provider Notes (Signed)
[facility] DEPT Provider Note   CSN: [id number] Arrival date & time: 05/04/16  1139     History   Chief Complaint Chief Complaint  Patient presents with  . Numbness    HPI Frederick Holder is a 66 y.o. male.  HPI   Around Thanksgiving developed right sided weakness since Thanksgiving, had limp but had limped prior due to injury. Around XMas was limping but really "everything" seemed different, low energy, bad limp having trouble with everything.  Doesn't want to come to Dr, and didn't tell [facility] about symptoms until Tuesday.  Then they wanted to do emergency MRI but they couldn't do it.  Weakness on right side worsening since around Thanksgiving, right leg, right arm.  No trouble with speech, no double vision, visual field problems, no blurry vision, no numbness, no facial droop. No neck pain. Chronic back pain unchanged.  Pt reports that the [place] sent here for MRI.  Patient reports he does not want to stay for results of MRI nor admission. Reports he is simply here for an MRI and his [facility] doctors will follow up on this.  Past Medical History:  Diagnosis Date  . Cancer ([place])    bladder ca with mets, stage IV  . COPD (chronic obstructive pulmonary disease) ([facility])     There are no active problems to display for this patient.   Past Surgical History:  Procedure Laterality Date  . BLADDER REMOVAL         Home Medications    Prior to Admission medications   Medication Sig Start Date End Date Taking? Authorizing Provider  Aspirin (ASPIR-81 PO) Take 81 mg by mouth daily.   Yes Historical Provider, MD  lisinopril (PRINIVIL,ZESTRIL) 5 MG tablet Take 5 mg by mouth daily.   Yes Historical Provider, MD  metoprolol succinate (TOPROL-XL) 100 MG 24 hr tablet Take 100 mg by mouth daily. Take with or immediately following a meal.   Yes Historical Provider, MD  Warfarin Sodium (COUMADIN PO) Take by mouth.   Yes Historical Provider, MD    Family History No family history on  file.  Social History Social History  Substance Use Topics  . Smoking status: Heavy Tobacco Smoker    Packs/day: 0.50    Types: Cigarettes  . Smokeless tobacco: Current User  . Alcohol use No     Allergies   Other   Review of Systems Review of Systems  Constitutional: Positive for fatigue. Negative for fever.  HENT: Negative for sore throat.   Eyes: Negative for visual disturbance.  Respiratory: Negative for shortness of breath.   Cardiovascular: Negative for chest pain.  Gastrointestinal: Negative for abdominal pain, nausea and vomiting.  Genitourinary: Negative for difficulty urinating.  Musculoskeletal: Positive for back pain. Negative for neck stiffness.  Skin: Negative for rash.  Neurological: Positive for weakness. Negative for seizures, syncope, facial asymmetry, speech difficulty, numbness and headaches.     Physical Exam Updated Vital Signs BP 99/77   Pulse 86   Temp 98.6 F (37 C)   Resp 18   Ht 5\' 9"  (1.753 m)   Wt 150 lb (68 kg)   SpO2 100%   BMI 22.15 kg/m   Physical Exam  Constitutional: He is oriented to person, place, and time. He appears well-developed and well-nourished. No distress.  HENT:  Head: Normocephalic and atraumatic.  Eyes: Conjunctivae and EOM are normal.  Neck: Normal range of motion.  Cardiovascular: Normal rate, regular rhythm, normal heart sounds and intact distal pulses.  Exam reveals no gallop and no friction rub.   No murmur heard. Pulmonary/Chest: Effort normal and breath sounds normal. No respiratory distress. He has no wheezes. He has no rales.  Abdominal: Soft. He exhibits no distension. There is no tenderness. There is no guarding.  Musculoskeletal: He exhibits no edema.  Neurological: He is alert and oriented to person, place, and time. No cranial nerve deficit.  5/5 strength left extremities. Right lower extremity with immediate right sided drift, unable to lift right leg against gravity  Did not complete  coordination or extremity sensation as patient stating here only for MRI and does not desire more evaluation  Skin: Skin is warm and dry. He is not diaphoretic.  Nursing note and vitals reviewed.    ED Treatments / Results  Labs (all labs ordered are listed, but only abnormal results are displayed) Labs Reviewed  COMPREHENSIVE METABOLIC PANEL - Abnormal; Notable for the following:       Result Value   Sodium 133 (*)    Potassium 5.2 (*)    CO2 19 (*)    Glucose, Bld 102 (*)    BUN 32 (*)    Creatinine, Ser 1.79 (*)    ALT 16 (*)    Total Bilirubin 1.3 (*)    GFR calc non Af Amer 38 (*)    GFR calc Af Amer 44 (*)    All other components within normal limits  CBC - Abnormal; Notable for the following:    HCT 38.6 (*)    RDW 16.6 (*)    All other components within normal limits  CBG MONITORING, ED    EKG  EKG Interpretation None       Radiology Mr Brain Wo Contrast  Result Date: 05/04/2016 CLINICAL DATA:  Right-sided weakness.  History of bladder cancer. EXAM: MRI HEAD WITHOUT CONTRAST TECHNIQUE: Multiplanar, multiecho pulse sequences of the brain and surrounding structures were obtained without intravenous contrast. COMPARISON:  Head CT 08/17/2013 FINDINGS: The patient declined IV contrast. The patient also terminated the examination before the coronal T2 sequence was obtained. The FLAIR sequence is moderately motion degraded. Brain: There are on the order of 15 brain masses which are particularly conspicuous on diffusion-weighted imaging with bearing degrees of predominantly peripherally restricted diffusion. The largest lesions are in the anterior frontal lobes and measure 3.4 x 2.3 cm on the right and 3.9 x 2.5 cm on the left. There are 2 adjacent lesions in the high left frontoparietal region with mild-to-moderate surrounding edema and with the larger lesion measuring 3 cm. Mild edema is noted associated with many of the other lesions bilaterally. There is no significant  mass effect or midline shift. There is a subcentimeter lesion in the posterior right midbrain near the distal aqueduct, and there is also a subcentimeter lesion in the anterior left medulla. Two subcentimeter lesions are present in the medial left cerebellum without significant edema. There is also a punctate subependymal lesion along the right lateral wall of the third ventricle. No definite acute infarct is identified. There is a small focus of chronic hemorrhage and gliosis in the right occipital lobe. 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 Mild chronic small vessel ischemic changes are present in the cerebral white matter. There is mild cerebral atrophy. Vascular: Major intracranial vascular flow voids are preserved. Skull and upper cervical spine: Unremarkable bone marrow signal. Sinuses/Orbits: Unremarkable orbits. Minimal left maxillary sinus secretions. Trace right mastoid effusion. Other: None. IMPRESSION: Supratentorial and infratentorial brain masses consistent with  metastatic disease. Up to moderate vasogenic edema in the high left frontoparietal region. No significant mass effect. These results were called by telephone at the time of interpretation on 05/04/2016 at 5:58 pm to Dr. Gareth Morgan , who verbally acknowledged these results. Electronically Signed   By: Logan Bores M.D.   On: 05/04/2016 18:07    Procedures Procedures (including critical care time)  Medications Ordered in ED Medications - No data to display   Initial Impression / Assessment and Plan / ED Course  I have reviewed the triage vital signs and the nursing notes.  Pertinent labs & imaging results that were available during my care of the patient were reviewed by me and considered in my medical decision making (see chart for details).     65 year old male with a history of metastatic bladder cancer and COPD presents with concern for right sided weakness beginning around Thanksgiving with desire for MRI as  requested by his [facility] doctor.  MRI of the brain and cervical spine with and without were ordered for concern of possible metastases, however patient declined the contrast and MRI of the cervical spine.  Prior to obtaining images, patient reported that he would be leaving the hospitalist to discuss his MRI is complete, and follow-up with the results with his [facility] doctor. Discussed my recommendation that patient await results, because if a CVA is present, he would require workup for prevention of future strokes as well as rehabilitation, and if other metastases are present, he may require further evaluation.  Patient agitated with wait and would only like MRI to be completed and to go home, with he and his wife understanding my recommendation for completion of work up.   Patient states he would like to leave AMA. MR revealing multiple metastases which were discussed with radiology as well as patient. No significant swelling. Pt to follow up with [facility] physician.   Final Clinical Impressions(s) / ED Diagnoses   Final diagnoses:  Right sided weakness  Metastatic cancer [facility])    New Prescriptions Discharge Medication List as of 05/04/2016  5:56 PM       Gareth Morgan, MD 05/05/16 [phone]

## 2016-05-04 NOTE — ED Notes (Signed)
Pt leaving AMA at this time, Dr. Billy Fischer has discussed risks and benefits and patient still adamant about leaving. Wheeled to car to be transported home by wife.

## 2016-05-04 NOTE — ED Notes (Signed)
Pt transporting to MRI.  

## 2016-05-04 NOTE — ED Triage Notes (Addendum)
Pt states numbness to R arm since Thanksgiving that is getting progressively worse to the point that he can barely walk and when he stands he becomes dizzy.  Hx of Stage IV bladder ca with mets to hips and lungs.  Denies headaches.  Pt just had PET scan on Monday.

## 2016-06-14 DEATH — deceased
